# Patient Record
Sex: Female | Born: 1985 | Race: White | Hispanic: No | Marital: Married | Smoking: Never smoker
Health system: Southern US, Community
[De-identification: ages and names within clinical notes are randomized; demographics above are authoritative.]

## PROBLEM LIST (undated history)

## (undated) DIAGNOSIS — R011 Cardiac murmur, unspecified: Secondary | ICD-10-CM

## (undated) DIAGNOSIS — K219 Gastro-esophageal reflux disease without esophagitis: Secondary | ICD-10-CM

## (undated) DIAGNOSIS — F419 Anxiety disorder, unspecified: Secondary | ICD-10-CM

## (undated) HISTORY — PX: OTHER SURGICAL HISTORY: SHX169

---

## 2017-08-23 ENCOUNTER — Encounter: Payer: Self-pay | Admitting: Internal Medicine

## 2017-10-07 ENCOUNTER — Telehealth: Payer: Self-pay | Admitting: *Deleted

## 2017-10-07 ENCOUNTER — Other Ambulatory Visit: Payer: Self-pay | Admitting: *Deleted

## 2017-10-07 ENCOUNTER — Encounter: Payer: Self-pay | Admitting: Nurse Practitioner

## 2017-10-07 ENCOUNTER — Encounter: Payer: Self-pay | Admitting: *Deleted

## 2017-10-07 ENCOUNTER — Ambulatory Visit (INDEPENDENT_AMBULATORY_CARE_PROVIDER_SITE_OTHER): Payer: 59 | Admitting: Nurse Practitioner

## 2017-10-07 DIAGNOSIS — R109 Unspecified abdominal pain: Secondary | ICD-10-CM | POA: Insufficient documentation

## 2017-10-07 DIAGNOSIS — K5900 Constipation, unspecified: Secondary | ICD-10-CM | POA: Diagnosis not present

## 2017-10-07 DIAGNOSIS — R1084 Generalized abdominal pain: Secondary | ICD-10-CM | POA: Diagnosis not present

## 2017-10-07 DIAGNOSIS — R1013 Epigastric pain: Secondary | ICD-10-CM | POA: Diagnosis not present

## 2017-10-07 MED ORDER — PANTOPRAZOLE SODIUM 40 MG PO TBEC: 40.0000 mg | DELAYED_RELEASE_TABLET | Freq: Every day | ORAL | 3 refills | Status: DC

## 2017-10-07 NOTE — Assessment & Plan Note (Signed)
The patient describes mild intermittent constipation.  No red flag/warning signs or symptoms.  At this point I will have her start taking Colace once a day to help have better bowel movements.  Return for follow-up in 3 months.

## 2017-10-07 NOTE — Patient Instructions (Signed)
1. I have sent a prescription for Protonix to your pharmacy.  Take this once a day on an empty stomach. 2. Stop taking Prilosec. 3. Take Zantac once a day in the evenings. 4. Start taking Colace stool softener once a day.  You can obtain this over-the-counter. 5. We will help you schedule the ultrasound of your gallbladder. 6. We will help schedule your upper endoscopy to evaluate your stomach symptoms. 7. Return for follow-up in 3 months. 8. Call us if you have any questions or concerns.

## 2017-10-07 NOTE — Assessment & Plan Note (Signed)
Abdominal pain includes "hot" feeling in her stomach as well as right upper quadrant pain.  The epigastric/stomach pain started a few months ago and was relatively rapid onset.  Because of this we will do an EGD as per above.  The right upper quadrant pain started about 1 month after she began eating a keto diet which is high fat high protein.  She may have precipitated some biliary symptoms.  I will check a right upper quadrant ultrasound for cholelithiasis.  Pending the results she may need a HIDA scan for further biliary evaluation.  At the end of our visit she did mention that her right upper quadrant pain tends to be worse after eating.  Return for follow-up in 3 months.

## 2017-10-07 NOTE — H&P (View-Only) (Signed)
Primary Care Physician:  Erasmo Downer, NP Primary Gastroenterologist:  Dr. Jena Gauss  Chief Complaint  Patient presents with  . Abdominal Pain    comes and goes, "stomach gets hot"; tried Zantac and Omeprazole    HPI:   Holly Patton is a 32 y.o. female who presents on referral from primary care for abdominal pain.  PCP note reviewed dated 08/17/2017.  The patient was seen on referral from urgent care where she was given a prescription for Prilosec for "stomach ulcer symptoms".  She was also given Zantac.  Her symptoms improved somewhat on these.  [facility] recommended Carafate, refill omeprazole, start Zantac.  Referred to GI.  Labs included with PCP notes indicate normal CMP, normal CBC specifically hemoglobin of 13.9 and platelet count of 281.  Today she states she's doing ok overall. Has been on omeprazole and Zantac with minimal improvement in symptoms ("barely"). Ran out of PPI a week ago so has been on Zantac twice daily. Sometimes during the day she'll take Milk of Magnesia, which hasn't helped. Hasn't noticed a difference in her symptoms when she ran out of PPI. Pain is RUQ point tenderness and when she pushes on it it has a "gristle feeling, like a dime or penny." Pain described as burning. Also with a "hot" sensation only in her stomach. No esophageal burning noted. Side pains started a month after starting a keto diet. Hotness in the stomach started a few months ago. Denies hematochezia, melena, dysphagia, fever, chills, unintentional weight loss. Has had some intermittent/rare lightheadedness. Also with significant fatigue. Also used to have a bowel movement twice a day. Now with increased constipation with hard stools, straining intermittently. Now has a bowel movement about about every other day. Denies chest pain, dyspnea, dizziness, syncope, near syncope. Denies any other upper or lower GI symptoms.   Not currently on any regular medications. Denies NSAIDs and ASA  powders.  History reviewed. No pertinent past medical history.  Past Surgical History:  Procedure Laterality Date  . NONE TO DATE      No current outpatient medications on file.   No current facility-administered medications for this visit.     Allergies as of 10/07/2017 - Review Complete 10/07/2017  Allergen Reaction Noted  . Sulfa antibiotics Rash 10/07/2017    History reviewed. No pertinent family history.  Social History   Socioeconomic History  . Marital status: Married    Spouse name: Not on file  . Number of children: Not on file  . Years of education: Not on file  . Highest education level: Not on file  Social Needs  . Financial resource strain: Not on file  . Food insecurity - worry: Not on file  . Food insecurity - inability: Not on file  . Transportation needs - medical: Not on file  . Transportation needs - non-medical: Not on file  Occupational History  . Not on file  Tobacco Use  . Smoking status: Never Smoker  . Smokeless tobacco: Never Used  Substance and Sexual Activity  . Alcohol use: No    Frequency: Never  . Drug use: No  . Sexual activity: Not on file  Other Topics Concern  . Not on file  Social History Narrative  . Not on file    Review of Systems: General: Negative for anorexia, weight loss, fever, chills, fatigue, weakness. ENT: Negative for hoarseness, difficulty swallowing , nasal congestion. CV: Negative for chest pain, angina, palpitations, dyspnea on exertion, peripheral edema.  Respiratory: Negative  for dyspnea at rest, dyspnea on exertion, cough, sputum, wheezing.  GI: See history of present illness. MS: Negative for joint pain, low back pain.  Derm: Negative for rash or itching.  Endo: Negative for unusual weight change.  Heme: Negative for bruising or bleeding. Allergy: Negative for rash or hives.    Physical Exam: BP 110/73   Pulse 71   Temp (!) 96.7 F (35.9 C) (Oral)   Ht 5\' 1"  (1.549 m)   Wt 109 lb 12.8 oz  (49.8 kg)   LMP 10/01/2017 (Approximate)   BMI 20.75 kg/m  General:   Alert and oriented. Pleasant and cooperative. Well-nourished and well-developed.  Eyes:  Without icterus, sclera clear and conjunctiva pink.  Ears:  Normal auditory acuity. Cardiovascular:  S1, S2 present without murmurs appreciated. Extremities without clubbing or edema. Respiratory:  Clear to auscultation bilaterally. No wheezes, rales, or rhonchi. No distress.  Gastrointestinal:  +BS, soft, non-tender and non-distended. No HSM noted. No guarding or rebound. No masses appreciated.  Rectal:  Deferred  Musculoskalatal:  Symmetrical without gross deformities. Neurologic:  Alert and oriented x4;  grossly normal neurologically. Psych:  Alert and cooperative. Normal mood and affect. Heme/Lymph/Immune: No excessive bruising noted.    10/07/2017 10:41 AM   Disclaimer: This note was dictated with voice recognition software. Similar sounding words can inadvertently be transcribed and may not be corrected upon review.

## 2017-10-07 NOTE — Progress Notes (Signed)
CC'D TO PCP °

## 2017-10-07 NOTE — Telephone Encounter (Signed)
Spoke with pt and is aware pre-op scheduled for 10/26/17 at 12:45pm. Letter mailed.

## 2017-10-07 NOTE — Assessment & Plan Note (Addendum)
The patient began having relatively acute onset dyspepsia symptoms including a burning in her stomach, epigastric pain.  She was started on Prilosec which did not help much.  At this point I will change her to Protonix twice daily.  We will set her up for an EGD for dyspepsia poorly responding to PPI therapy.  She would likely benefit from a biopsy for H. pylori.  Return for follow-up in 3 months.  Proceed with EGD on propofol/MAC with Dr. Gala Romney in near future: the risks, benefits, and alternatives have been discussed with the patient in detail. The patient states understanding and desires to proceed.  The patient is not on any medications.  She seems very high anxiety and admits that she is high anxiety.  She is younger age.  Because of potential sedation difficulties we will plan for the procedure on propofol/MAC to promote adequate sedation.

## 2017-10-07 NOTE — Progress Notes (Signed)
Primary Care Physician:  Erasmo Downer, NP Primary Gastroenterologist:  Dr. Jena Gauss  Chief Complaint  Patient presents with  . Abdominal Pain    comes and goes, "stomach gets hot"; tried Zantac and Omeprazole    HPI:   Holly Patton is a 32 y.o. female who presents on referral from primary care for abdominal pain.  PCP note reviewed dated 08/17/2017.  The patient was seen on referral from urgent care where she was given a prescription for Prilosec for "stomach ulcer symptoms".  She was also given Zantac.  Her symptoms improved somewhat on these.  [facility] recommended Carafate, refill omeprazole, start Zantac.  Referred to GI.  Labs included with PCP notes indicate normal CMP, normal CBC specifically hemoglobin of 13.9 and platelet count of 281.  Today she states she's doing ok overall. Has been on omeprazole and Zantac with minimal improvement in symptoms ("barely"). Ran out of PPI a week ago so has been on Zantac twice daily. Sometimes during the day she'll take Milk of Magnesia, which hasn't helped. Hasn't noticed a difference in her symptoms when she ran out of PPI. Pain is RUQ point tenderness and when she pushes on it it has a "gristle feeling, like a dime or penny." Pain described as burning. Also with a "hot" sensation only in her stomach. No esophageal burning noted. Side pains started a month after starting a keto diet. Hotness in the stomach started a few months ago. Denies hematochezia, melena, dysphagia, fever, chills, unintentional weight loss. Has had some intermittent/rare lightheadedness. Also with significant fatigue. Also used to have a bowel movement twice a day. Now with increased constipation with hard stools, straining intermittently. Now has a bowel movement about about every other day. Denies chest pain, dyspnea, dizziness, syncope, near syncope. Denies any other upper or lower GI symptoms.   Not currently on any regular medications. Denies NSAIDs and ASA  powders.  History reviewed. No pertinent past medical history.  Past Surgical History:  Procedure Laterality Date  . NONE TO DATE      No current outpatient medications on file.   No current facility-administered medications for this visit.     Allergies as of 10/07/2017 - Review Complete 10/07/2017  Allergen Reaction Noted  . Sulfa antibiotics Rash 10/07/2017    History reviewed. No pertinent family history.  Social History   Socioeconomic History  . Marital status: Married    Spouse name: Not on file  . Number of children: Not on file  . Years of education: Not on file  . Highest education level: Not on file  Social Needs  . Financial resource strain: Not on file  . Food insecurity - worry: Not on file  . Food insecurity - inability: Not on file  . Transportation needs - medical: Not on file  . Transportation needs - non-medical: Not on file  Occupational History  . Not on file  Tobacco Use  . Smoking status: Never Smoker  . Smokeless tobacco: Never Used  Substance and Sexual Activity  . Alcohol use: No    Frequency: Never  . Drug use: No  . Sexual activity: Not on file  Other Topics Concern  . Not on file  Social History Narrative  . Not on file    Review of Systems: General: Negative for anorexia, weight loss, fever, chills, fatigue, weakness. ENT: Negative for hoarseness, difficulty swallowing , nasal congestion. CV: Negative for chest pain, angina, palpitations, dyspnea on exertion, peripheral edema.  Respiratory: Negative  for dyspnea at rest, dyspnea on exertion, cough, sputum, wheezing.  GI: See history of present illness. MS: Negative for joint pain, low back pain.  Derm: Negative for rash or itching.  Endo: Negative for unusual weight change.  Heme: Negative for bruising or bleeding. Allergy: Negative for rash or hives.    Physical Exam: BP 110/73   Pulse 71   Temp (!) 96.7 F (35.9 C) (Oral)   Ht 5\' 1"  (1.549 m)   Wt 109 lb 12.8 oz  (49.8 kg)   LMP 10/01/2017 (Approximate)   BMI 20.75 kg/m  General:   Alert and oriented. Pleasant and cooperative. Well-nourished and well-developed.  Eyes:  Without icterus, sclera clear and conjunctiva pink.  Ears:  Normal auditory acuity. Cardiovascular:  S1, S2 present without murmurs appreciated. Extremities without clubbing or edema. Respiratory:  Clear to auscultation bilaterally. No wheezes, rales, or rhonchi. No distress.  Gastrointestinal:  +BS, soft, non-tender and non-distended. No HSM noted. No guarding or rebound. No masses appreciated.  Rectal:  Deferred  Musculoskalatal:  Symmetrical without gross deformities. Neurologic:  Alert and oriented x4;  grossly normal neurologically. Psych:  Alert and cooperative. Normal mood and affect. Heme/Lymph/Immune: No excessive bruising noted.    10/07/2017 10:41 AM   Disclaimer: This note was dictated with voice recognition software. Similar sounding words can inadvertently be transcribed and may not be corrected upon review.

## 2017-10-13 ENCOUNTER — Ambulatory Visit: Payer: Self-pay | Admitting: Nurse Practitioner

## 2017-10-13 ENCOUNTER — Ambulatory Visit (HOSPITAL_COMMUNITY): Admission: RE | Admit: 2017-10-13 | Payer: 59 | Source: Ambulatory Visit

## 2017-10-14 ENCOUNTER — Ambulatory Visit (HOSPITAL_COMMUNITY)
Admission: RE | Admit: 2017-10-14 | Discharge: 2017-10-14 | Disposition: A | Discharge status: Home / Self Care | Payer: 59 | Source: Ambulatory Visit | Attending: Nurse Practitioner | Admitting: Nurse Practitioner

## 2017-10-14 DIAGNOSIS — R932 Abnormal findings on diagnostic imaging of liver and biliary tract: Secondary | ICD-10-CM | POA: Insufficient documentation

## 2017-10-14 DIAGNOSIS — R1013 Epigastric pain: Secondary | ICD-10-CM | POA: Insufficient documentation

## 2017-10-14 DIAGNOSIS — R1084 Generalized abdominal pain: Secondary | ICD-10-CM | POA: Insufficient documentation

## 2017-10-14 DIAGNOSIS — K5900 Constipation, unspecified: Secondary | ICD-10-CM | POA: Insufficient documentation

## 2017-10-18 ENCOUNTER — Telehealth: Payer: Self-pay | Admitting: *Deleted

## 2017-10-18 NOTE — Telephone Encounter (Signed)
Patient called requesting her u/s results. Please advise thanks

## 2017-10-19 ENCOUNTER — Telehealth: Payer: Self-pay | Admitting: Internal Medicine

## 2017-10-19 NOTE — Telephone Encounter (Signed)
Lmom, will notified pt when results are available.

## 2017-10-19 NOTE — Telephone Encounter (Signed)
Pt had US done on 1/24 and was calling to see if her results were available. Please call [phone]

## 2017-10-19 NOTE — Telephone Encounter (Signed)
See result note.  

## 2017-10-22 NOTE — Patient Instructions (Signed)
Holly Patton  10/22/2017     @PREFPERIOPPHARMACY @   Your procedure is scheduled on  11/01/2017 .  Report to [facility] at  1100   A.M.  Call this number if you have problems the morning of surgery:  [phone]   Remember:  Do not eat food or drink liquids after midnight.  Take these medicines the morning of surgery with A SIP OF WATER  Protonix, zantac.   Do not wear jewelry, make-up or nail polish.  Do not wear lotions, powders, or perfumes, or deodorant.  Do not shave 48 hours prior to surgery.  Men may shave face and neck.  Do not bring valuables to the hospital.  [facility] is not responsible for any belongings or valuables.  Contacts, dentures or bridgework may not be worn into surgery.  Leave your suitcase in the car.  After surgery it may be brought to your room.  For patients admitted to the hospital, discharge time will be determined by your treatment team.  Patients discharged the day of surgery will not be allowed to drive home.   Name and phone number of your driver:   family Special instructions:  None  Please read over the following fact sheets that you were given. Anesthesia Post-op Instructions and Care and Recovery After Surgery       Esophagogastroduodenoscopy Esophagogastroduodenoscopy (EGD) is a procedure to examine the lining of the esophagus, stomach, and first part of the small intestine (duodenum). This procedure is done to check for problems such as inflammation, bleeding, ulcers, or growths. During this procedure, a long, flexible, lighted tube with a camera attached (endoscope) is inserted down the throat. Tell a health care provider about:  Any allergies you have.  All medicines you are taking, including vitamins, herbs, eye drops, creams, and over-the-counter medicines.  Any problems you or family members have had with anesthetic medicines.  Any blood disorders you have.  Any surgeries you have had.  Any medical  conditions you have.  Whether you are pregnant or may be pregnant. What are the risks? Generally, this is a safe procedure. However, problems may occur, including:  Infection.  Bleeding.  A tear (perforation) in the esophagus, stomach, or duodenum.  Trouble breathing.  Excessive sweating.  Spasms of the larynx.  A slowed heartbeat.  Low blood pressure.  What happens before the procedure?  Follow instructions from your health care provider about eating or drinking restrictions.  Ask your health care provider about: ? Changing or stopping your regular medicines. This is especially important if you are taking diabetes medicines or blood thinners. ? Taking medicines such as aspirin and ibuprofen. These medicines can thin your blood. Do not take these medicines before your procedure if your health care provider instructs you not to.  Plan to have someone take you home after the procedure.  If you wear dentures, be ready to remove them before the procedure. What happens during the procedure?  To reduce your risk of infection, your health care team will wash or sanitize their hands.  An IV tube will be put in a vein in your hand or arm. You will get medicines and fluids through this tube.  You will be given one or more of the following: ? A medicine to help you relax (sedative). ? A medicine to numb the area (local anesthetic). This medicine may be sprayed into your throat. It will make you feel more comfortable and keep  you from gagging or coughing during the procedure. ? A medicine for pain.  A mouth guard may be placed in your mouth to protect your teeth and to keep you from biting on the endoscope.  You will be asked to lie on your left side.  The endoscope will be lowered down your throat into your esophagus, stomach, and duodenum.  Air will be put into the endoscope. This will help your health care provider see better.  The lining of your esophagus, stomach, and  duodenum will be examined.  Your health care provider may: ? Take a tissue sample so it can be looked at in a lab (biopsy). ? Remove growths. ? Remove objects (foreign bodies) that are stuck. ? Treat any bleeding with medicines or other devices that stop tissue from bleeding. ? Widen (dilate) or stretch narrowed areas of your esophagus and stomach.  The endoscope will be taken out. The procedure may vary among health care providers and hospitals. What happens after the procedure?  Your blood pressure, heart rate, breathing rate, and blood oxygen level will be monitored often until the medicines you were given have worn off.  Do not eat or drink anything until the numbing medicine has worn off and your gag reflex has returned. This information is not intended to replace advice given to you by your health care provider. Make sure you discuss any questions you have with your health care provider. Document Released: 01/08/2005 Document Revised: 02/13/2016 Document Reviewed: 08/01/2015 Elsevier Interactive Patient Education  2018 [employer]. Esophagogastroduodenoscopy, Care After Refer to this sheet in the next few weeks. These instructions provide you with information about caring for yourself after your procedure. Your health care provider may also give you more specific instructions. Your treatment has been planned according to current medical practices, but problems sometimes occur. Call your health care provider if you have any problems or questions after your procedure. What can I expect after the procedure? After the procedure, it is common to have:  A sore throat.  Nausea.  Bloating.  Dizziness.  Fatigue.  Follow these instructions at home:  Do not eat or drink anything until the numbing medicine (local anesthetic) has worn off and your gag reflex has returned. You will know that the local anesthetic has worn off when you can swallow comfortably.  Do not drive for 24 hours  if you received a medicine to help you relax (sedative).  If your health care provider took a tissue sample for testing during the procedure, make sure to get your test results. This is your responsibility. Ask your health care provider or the department performing the test when your results will be ready.  Keep all follow-up visits as told by your health care provider. This is important. Contact a health care provider if:  You cannot stop coughing.  You are not urinating.  You are urinating less than usual. Get help right away if:  You have trouble swallowing.  You cannot eat or drink.  You have throat or chest pain that gets worse.  You are dizzy or light-headed.  You faint.  You have nausea or vomiting.  You have chills.  You have a fever.  You have severe abdominal pain.  You have black, tarry, or bloody stools. This information is not intended to replace advice given to you by your health care provider. Make sure you discuss any questions you have with your health care provider. Document Released: 08/24/2012 Document Revised: 02/13/2016 Document Reviewed: 08/01/2015 Elsevier  Interactive Patient Education  2018 Elsevier Inc.  Monitored Anesthesia Care Anesthesia is a term that refers to techniques, procedures, and medicines that help a person stay safe and comfortable during a medical procedure. Monitored anesthesia care, or sedation, is one type of anesthesia. Your anesthesia specialist may recommend sedation if you will be having a procedure that does not require you to be unconscious, such as:  Cataract surgery.  A dental procedure.  A biopsy.  A colonoscopy.  During the procedure, you may receive a medicine to help you relax (sedative). There are three levels of sedation:  Mild sedation. At this level, you may feel awake and relaxed. You will be able to follow directions.  Moderate sedation. At this level, you will be sleepy. You may not remember the  procedure.  Deep sedation. At this level, you will be asleep. You will not remember the procedure.  The more medicine you are given, the deeper your level of sedation will be. Depending on how you respond to the procedure, the anesthesia specialist may change your level of sedation or the type of anesthesia to fit your needs. An anesthesia specialist will monitor you closely during the procedure. Let your health care provider know about:  Any allergies you have.  All medicines you are taking, including vitamins, herbs, eye drops, creams, and over-the-counter medicines.  Any use of steroids (by mouth or as a cream).  Any problems you or family members have had with sedatives and anesthetic medicines.  Any blood disorders you have.  Any surgeries you have had.  Any medical conditions you have, such as sleep apnea.  Whether you are pregnant or may be pregnant.  Any use of cigarettes, alcohol, or street drugs. What are the risks? Generally, this is a safe procedure. However, problems may occur, including:  Getting too much medicine (oversedation).  Nausea.  Allergic reaction to medicines.  Trouble breathing. If this happens, a breathing tube may be used to help with breathing. It will be removed when you are awake and breathing on your own.  Heart trouble.  Lung trouble.  Before the procedure Staying hydrated Follow instructions from your health care provider about hydration, which may include:  Up to 2 hours before the procedure - you may continue to drink clear liquids, such as water, clear fruit juice, black coffee, and plain tea.  Eating and drinking restrictions Follow instructions from your health care provider about eating and drinking, which may include:  8 hours before the procedure - stop eating heavy meals or foods such as meat, fried foods, or fatty foods.  6 hours before the procedure - stop eating light meals or foods, such as toast or cereal.  6 hours  before the procedure - stop drinking milk or drinks that contain milk.  2 hours before the procedure - stop drinking clear liquids.  Medicines Ask your health care provider about:  Changing or stopping your regular medicines. This is especially important if you are taking diabetes medicines or blood thinners.  Taking medicines such as aspirin and ibuprofen. These medicines can thin your blood. Do not take these medicines before your procedure if your health care provider instructs you not to.  Tests and exams  You will have a physical exam.  You may have blood tests done to show: ? How well your kidneys and liver are working. ? How well your blood can clot.  General instructions  Plan to have someone take you home from the hospital or clinic.  If you will be going home right after the procedure, plan to have someone with you for 24 hours.  What happens during the procedure?  Your blood pressure, heart rate, breathing, level of pain and overall condition will be monitored.  An IV tube will be inserted into one of your veins.  Your anesthesia specialist will give you medicines as needed to keep you comfortable during the procedure. This may mean changing the level of sedation.  The procedure will be performed. After the procedure  Your blood pressure, heart rate, breathing rate, and blood oxygen level will be monitored until the medicines you were given have worn off.  Do not drive for 24 hours if you received a sedative.  You may: ? Feel sleepy, clumsy, or nauseous. ? Feel forgetful about what happened after the procedure. ? Have a sore throat if you had a breathing tube during the procedure. ? Vomit. This information is not intended to replace advice given to you by your health care provider. Make sure you discuss any questions you have with your health care provider. Document Released: 06/03/2005 Document Revised: 02/14/2016 Document Reviewed: 12/29/2015 Elsevier  Interactive Patient Education  2018 Elsevier Inc. Monitored Anesthesia Care, Care After These instructions provide you with information about caring for yourself after your procedure. Your health care provider may also give you more specific instructions. Your treatment has been planned according to current medical practices, but problems sometimes occur. Call your health care provider if you have any problems or questions after your procedure. What can I expect after the procedure? After your procedure, it is common to:  Feel sleepy for several hours.  Feel clumsy and have poor balance for several hours.  Feel forgetful about what happened after the procedure.  Have poor judgment for several hours.  Feel nauseous or vomit.  Have a sore throat if you had a breathing tube during the procedure.  Follow these instructions at home: For at least 24 hours after the procedure:   Do not: ? Participate in activities in which you could fall or become injured. ? Drive. ? Use heavy machinery. ? Drink alcohol. ? Take sleeping pills or medicines that cause drowsiness. ? Make important decisions or sign legal documents. ? Take care of children on your own.  Rest. Eating and drinking  Follow the diet that is recommended by your health care provider.  If you vomit, drink water, juice, or soup when you can drink without vomiting.  Make sure you have little or no nausea before eating solid foods. General instructions  Have a responsible adult stay with you until you are awake and alert.  Take over-the-counter and prescription medicines only as told by your health care provider.  If you smoke, do not smoke without supervision.  Keep all follow-up visits as told by your health care provider. This is important. Contact a health care provider if:  You keep feeling nauseous or you keep vomiting.  You feel light-headed.  You develop a rash.  You have a fever. Get help right away  if:  You have trouble breathing. This information is not intended to replace advice given to you by your health care provider. Make sure you discuss any questions you have with your health care provider. Document Released: 12/29/2015 Document Revised: 04/29/2016 Document Reviewed: 12/29/2015 Elsevier Interactive Patient Education  [employer]2018 Elsevier Inc.

## 2017-10-26 ENCOUNTER — Other Ambulatory Visit: Payer: Self-pay

## 2017-10-26 ENCOUNTER — Encounter (HOSPITAL_COMMUNITY)
Admission: RE | Admit: 2017-10-26 | Discharge: 2017-10-26 | Disposition: A | Discharge status: Home / Self Care | Payer: 59 | Source: Ambulatory Visit | Attending: Internal Medicine | Admitting: Internal Medicine

## 2017-10-26 ENCOUNTER — Encounter (HOSPITAL_COMMUNITY): Payer: Self-pay

## 2017-10-26 DIAGNOSIS — R1084 Generalized abdominal pain: Secondary | ICD-10-CM | POA: Insufficient documentation

## 2017-10-26 DIAGNOSIS — Z01818 Encounter for other preprocedural examination: Secondary | ICD-10-CM | POA: Diagnosis present

## 2017-10-26 DIAGNOSIS — R1013 Epigastric pain: Secondary | ICD-10-CM | POA: Insufficient documentation

## 2017-10-26 HISTORY — DX: Gastro-esophageal reflux disease without esophagitis: K21.9

## 2017-10-26 HISTORY — DX: Anxiety disorder, unspecified: F41.9

## 2017-10-26 HISTORY — DX: Cardiac murmur, unspecified: R01.1

## 2017-10-26 LAB — CBC WITH DIFFERENTIAL/PLATELET
BASOS PCT: 0 %
Basophils Absolute: 0 10*3/uL (ref 0.0–0.1)
EOS ABS: 0.1 10*3/uL (ref 0.0–0.7)
Eosinophils Relative: 1 %
HEMATOCRIT: 39 % (ref 36.0–46.0)
HEMOGLOBIN: 13 g/dL (ref 12.0–15.0)
Lymphocytes Relative: 32 %
Lymphs Abs: 2.3 10*3/uL (ref 0.7–4.0)
MCH: 30.2 pg (ref 26.0–34.0)
MCHC: 33.3 g/dL (ref 30.0–36.0)
MCV: 90.7 fL (ref 78.0–100.0)
Monocytes Absolute: 0.5 10*3/uL (ref 0.1–1.0)
Monocytes Relative: 7 %
NEUTROS ABS: 4.3 10*3/uL (ref 1.7–7.7)
NEUTROS PCT: 60 %
Platelets: 231 10*3/uL (ref 150–400)
RBC: 4.3 MIL/uL (ref 3.87–5.11)
RDW: 12.4 % (ref 11.5–15.5)
WBC: 7.1 10*3/uL (ref 4.0–10.5)

## 2017-10-26 LAB — HCG, SERUM, QUALITATIVE: PREG SERUM: NEGATIVE

## 2017-11-01 ENCOUNTER — Ambulatory Visit (HOSPITAL_COMMUNITY): Payer: 59 | Admitting: Anesthesiology

## 2017-11-01 ENCOUNTER — Encounter (HOSPITAL_COMMUNITY)
Admission: RE | Disposition: A | Discharge status: Home / Self Care | Payer: Self-pay | Source: Ambulatory Visit | Attending: Internal Medicine

## 2017-11-01 ENCOUNTER — Encounter (HOSPITAL_COMMUNITY): Payer: Self-pay | Admitting: Anesthesiology

## 2017-11-01 ENCOUNTER — Ambulatory Visit (HOSPITAL_COMMUNITY)
Admission: RE | Admit: 2017-11-01 | Discharge: 2017-11-01 | Disposition: A | Discharge status: Home / Self Care | Payer: 59 | Source: Ambulatory Visit | Attending: Internal Medicine | Admitting: Internal Medicine

## 2017-11-01 DIAGNOSIS — K210 Gastro-esophageal reflux disease with esophagitis: Secondary | ICD-10-CM | POA: Insufficient documentation

## 2017-11-01 DIAGNOSIS — R1013 Epigastric pain: Secondary | ICD-10-CM

## 2017-11-01 DIAGNOSIS — R1084 Generalized abdominal pain: Secondary | ICD-10-CM

## 2017-11-01 DIAGNOSIS — K209 Esophagitis, unspecified: Secondary | ICD-10-CM | POA: Diagnosis not present

## 2017-11-01 DIAGNOSIS — K5900 Constipation, unspecified: Secondary | ICD-10-CM | POA: Diagnosis not present

## 2017-11-01 HISTORY — PX: ESOPHAGOGASTRODUODENOSCOPY (EGD) WITH PROPOFOL: SHX5813

## 2017-11-01 SURGERY — ESOPHAGOGASTRODUODENOSCOPY (EGD) WITH PROPOFOL
Anesthesia: Monitor Anesthesia Care

## 2017-11-01 MED ORDER — MIDAZOLAM HCL 2 MG/2ML IJ SOLN
1.0000 mg | INTRAMUSCULAR | Status: AC
  Administered 2017-11-01: 2 mg via INTRAVENOUS

## 2017-11-01 MED ORDER — PROPOFOL 500 MG/50ML IV EMUL
INTRAVENOUS | Status: DC | PRN
  Administered 2017-11-01: 125 ug/kg/min via INTRAVENOUS

## 2017-11-01 MED ORDER — FENTANYL CITRATE (PF) 100 MCG/2ML IJ SOLN
INTRAMUSCULAR | Status: AC
  Filled 2017-11-01: qty 2

## 2017-11-01 MED ORDER — MIDAZOLAM HCL 5 MG/5ML IJ SOLN
INTRAMUSCULAR | Status: DC | PRN
  Administered 2017-11-01: 2 mg via INTRAVENOUS

## 2017-11-01 MED ORDER — MIDAZOLAM HCL 2 MG/2ML IJ SOLN
INTRAMUSCULAR | Status: AC
  Filled 2017-11-01: qty 2

## 2017-11-01 MED ORDER — LIDOCAINE VISCOUS 2 % MT SOLN
15.0000 mL | Freq: Once | OROMUCOSAL | Status: AC
  Administered 2017-11-01: 5 mL via OROMUCOSAL

## 2017-11-01 MED ORDER — LIDOCAINE VISCOUS 2 % MT SOLN
OROMUCOSAL | Status: AC
  Filled 2017-11-01: qty 15

## 2017-11-01 MED ORDER — CHLORHEXIDINE GLUCONATE CLOTH 2 % EX PADS: 6.0000 | MEDICATED_PAD | Freq: Once | CUTANEOUS | Status: DC

## 2017-11-01 MED ORDER — PROPOFOL 10 MG/ML IV BOLUS
INTRAVENOUS | Status: AC
  Filled 2017-11-01: qty 20

## 2017-11-01 MED ORDER — LACTATED RINGERS IV SOLN
INTRAVENOUS | Status: DC
  Administered 2017-11-01: 12:00:00 via INTRAVENOUS

## 2017-11-01 MED ORDER — FENTANYL CITRATE (PF) 100 MCG/2ML IJ SOLN
25.0000 ug | Freq: Once | INTRAMUSCULAR | Status: AC
  Administered 2017-11-01: 25 ug via INTRAVENOUS

## 2017-11-01 NOTE — Discharge Instructions (Signed)
EGD Discharge instructions Please read the instructions outlined below and refer to this sheet in the next few weeks. These discharge instructions provide you with general information on caring for yourself after you leave the hospital. Your doctor may also give you specific instructions. While your treatment has been planned according to the most current medical practices available, unavoidable complications occasionally occur. If you have any problems or questions after discharge, please call your doctor. ACTIVITY  You may resume your regular activity but move at a slower pace for the next 24 hours.   Take frequent rest periods for the next 24 hours.   Walking will help expel (get rid of) the air and reduce the bloated feeling in your abdomen.   No driving for 24 hours (because of the anesthesia (medicine) used during the test).   You may shower.   Do not sign any important legal documents or operate any machinery for 24 hours (because of the anesthesia used during the test).  NUTRITION  Drink plenty of fluids.   You may resume your normal diet.   Begin with a light meal and progress to your normal diet.   Avoid alcoholic beverages for 24 hours or as instructed by your caregiver.  MEDICATIONS  You may resume your normal medications unless your caregiver tells you otherwise.  WHAT YOU CAN EXPECT TODAY  You may experience abdominal discomfort such as a feeling of fullness or gas pains.  FOLLOW-UP  Your doctor will discuss the results of your test with you.  SEEK IMMEDIATE MEDICAL ATTENTION IF ANY OF THE FOLLOWING OCCUR:  Excessive nausea (feeling sick to your stomach) and/or vomiting.   Severe abdominal pain and distention (swelling).   Trouble swallowing.   Temperature over 101 F (37.8 C).   Rectal bleeding or vomiting of blood.    GERD information provided  Would limit NSAIDs like ibuprofen as much as possible  Continue Protonix 40 mg daily  Office visit us  in 3 months.      Gastroesophageal Reflux Disease, Adult Normally, food travels down the esophagus and stays in the stomach to be digested. If a person has gastroesophageal reflux disease (GERD), food and stomach acid move back up into the esophagus. When this happens, the esophagus becomes sore and swollen (inflamed). Over time, GERD can make small holes (ulcers) in the lining of the esophagus. Follow these instructions at home: Diet  Follow a diet as told by your doctor. You may need to avoid foods and drinks such as: ? Coffee and tea (with or without caffeine). ? Drinks that contain alcohol. ? Energy drinks and sports drinks. ? Carbonated drinks or sodas. ? Chocolate and cocoa. ? Peppermint and mint flavorings. ? Garlic and onions. ? Horseradish. ? Spicy and acidic foods, such as peppers, chili powder, curry powder, vinegar, hot sauces, and BBQ sauce. ? Citrus fruit juices and citrus fruits, such as oranges, lemons, and limes. ? Tomato-based foods, such as red sauce, chili, salsa, and pizza with red sauce. ? Fried and fatty foods, such as donuts, french fries, potato chips, and high-fat dressings. ? High-fat meats, such as hot dogs, rib eye steak, sausage, ham, and bacon. ? High-fat dairy items, such as whole milk, butter, and cream cheese.  Eat small meals often. Avoid eating large meals.  Avoid drinking large amounts of liquid with your meals.  Avoid eating meals during the 2-3 hours before bedtime.  Avoid lying down right after you eat.  Do not exercise right after you eat.  General instructions  Pay attention to any changes in your symptoms.  Take over-the-counter and prescription medicines only as told by your doctor. Do not take aspirin, ibuprofen, or other NSAIDs unless your doctor says it is okay.  Do not use any tobacco products, including cigarettes, chewing tobacco, and e-cigarettes. If you need help quitting, ask your doctor.  Wear loose clothes. Do not wear  anything tight around your waist.  Raise (elevate) the head of your bed about 6 inches (15 cm).  Try to lower your stress. If you need help doing this, ask your doctor.  If you are overweight, lose an amount of weight that is healthy for you. Ask your doctor about a safe weight loss goal.  Keep all follow-up visits as told by your doctor. This is important. Contact a doctor if:  You have new symptoms.  You lose weight and you do not know why it is happening.  You have trouble swallowing, or it hurts to swallow.  You have wheezing or a cough that keeps happening.  Your symptoms do not get better with treatment.  You have a hoarse voice. Get help right away if:  You have pain in your arms, neck, jaw, teeth, or back.  You feel sweaty, dizzy, or light-headed.  You have chest pain or shortness of breath.  You throw up (vomit) and your throw up looks like blood or coffee grounds.  You pass out (faint).  Your poop (stool) is bloody or black.  You cannot swallow, drink, or eat. This information is not intended to replace advice given to you by your health care provider. Make sure you discuss any questions you have with your health care provider. Document Released: 02/24/2008 Document Revised: 02/13/2016 Document Reviewed: 01/02/2015 Elsevier Interactive Patient Education  2018 [employer].    PATIENT INSTRUCTIONS POST-ANESTHESIA  IMMEDIATELY FOLLOWING SURGERY:  Do not drive or operate machinery for the first twenty four hours after surgery.  Do not make any important decisions for twenty four hours after surgery or while taking narcotic pain medications or sedatives.  If you develop intractable nausea and vomiting or a severe headache please notify your doctor immediately.  FOLLOW-UP:  Please make an appointment with your surgeon as instructed. You do not need to follow up with anesthesia unless specifically instructed to do so.  WOUND CARE INSTRUCTIONS (if applicable):   Keep a dry clean dressing on the anesthesia/puncture wound site if there is drainage.  Once the wound has quit draining you may leave it open to air.  Generally you should leave the bandage intact for twenty four hours unless there is drainage.  If the epidural site drains for more than 36-48 hours please call the anesthesia department.  QUESTIONS?:  Please feel free to call your physician or the hospital operator if you have any questions, and they will be happy to assist you.

## 2017-11-01 NOTE — Transfer of Care (Signed)
Immediate Anesthesia Transfer of Care Note  Patient: Holly Patton  Procedure(s) Performed: ESOPHAGOGASTRODUODENOSCOPY (EGD) WITH PROPOFOL (N/A )  Patient Location: PACU  Anesthesia Type:MAC  Level of Consciousness: awake and patient cooperative  Airway & Oxygen Therapy: Patient Spontanous Breathing and Patient connected to face mask oxygen  Post-op Assessment: Report given to RN, Post -op Vital signs reviewed and stable and Patient moving all extremities  Post vital signs: Reviewed and stable  Last Vitals:  Vitals:   11/01/17 1205 11/01/17 1210  BP:    Pulse:    Resp: 14 18  Temp:    SpO2: 100% 100%    Last Pain:  Vitals:   11/01/17 1054  TempSrc: Oral      Patients Stated Pain Goal: 1 (09/32/67 1245)  Complications: No apparent anesthesia complications

## 2017-11-01 NOTE — Anesthesia Postprocedure Evaluation (Signed)
Anesthesia Post Note  Patient: Holly Patton  Procedure(s) Performed: ESOPHAGOGASTRODUODENOSCOPY (EGD) WITH PROPOFOL (N/A )  Patient location during evaluation: PACU Anesthesia Type: MAC Level of consciousness: awake and alert and patient cooperative Pain management: pain level controlled Vital Signs Assessment: post-procedure vital signs reviewed and stable Respiratory status: spontaneous breathing Cardiovascular status: blood pressure returned to baseline Postop Assessment: no apparent nausea or vomiting Anesthetic complications: no     Last Vitals:  Vitals:   11/01/17 1210 11/01/17 1245  BP:  103/66  Pulse:  78  Resp: 18 (!) 9  Temp:  (P) 37 C  SpO2: 100% 97%    Last Pain:  Vitals:   11/01/17 1054  TempSrc: Oral                 Jahmil Macleod J

## 2017-11-01 NOTE — Anesthesia Preprocedure Evaluation (Signed)
Anesthesia Evaluation  Patient identified by MRN, date of birth, ID band Patient awake    Reviewed: Allergy & Precautions, NPO status , Patient's Chart, lab work & pertinent test results  Airway Mallampati: II  TM Distance: >3 FB Neck ROM: Full    Dental  (+) Teeth Intact   Pulmonary neg pulmonary ROS,    breath sounds clear to auscultation       Cardiovascular negative cardio ROS   Rhythm:Regular Rate:Normal     Neuro/Psych PSYCHIATRIC DISORDERS Anxiety negative neurological ROS     GI/Hepatic Neg liver ROS, GERD  ,  Endo/Other  negative endocrine ROS  Renal/GU negative Renal ROS     Musculoskeletal   Abdominal   Peds  Hematology negative hematology ROS (+)   Anesthesia Other Findings   Reproductive/Obstetrics                             Anesthesia Physical Anesthesia Plan  ASA: II  Anesthesia Plan: MAC   Post-op Pain Management:    Induction: Intravenous  PONV Risk Score and Plan:   Airway Management Planned: Simple Face Mask  Additional Equipment:   Intra-op Plan:   Post-operative Plan:   Informed Consent: I have reviewed the patients History and Physical, chart, labs and discussed the procedure including the risks, benefits and alternatives for the proposed anesthesia with the patient or authorized representative who has indicated his/her understanding and acceptance.     Plan Discussed with:   Anesthesia Plan Comments:         Anesthesia Quick Evaluation

## 2017-11-01 NOTE — Op Note (Signed)
[facility] Patient Name: Holly Patton Procedure Date: 11/01/2017 11:52 AM MRN: [id number] Date of Birth: 02/24/1986 Attending MD: Gennette Pac , MD CSN: [id number] Age: 32 Admit Type: Outpatient Procedure:                Upper GI endoscopy Indications:              Dyspepsia Providers:                Gennette Pac, MD, Nena Polio, RN, Edythe Clarity, Technician Referring MD:              Medicines:                Propofol per Anesthesia Complications:            No immediate complications. Estimated Blood Loss:     Estimated blood loss: none. Procedure:                Pre-Anesthesia Assessment:                           - Prior to the procedure, a History and Physical                            was performed, and patient medications and                            allergies were reviewed. The patient's tolerance of                            previous anesthesia was also reviewed. The risks                            and benefits of the procedure and the sedation                            options and risks were discussed with the patient.                            All questions were answered, and informed consent                            was obtained. Prior Anticoagulants: The patient has                            taken no previous anticoagulant or antiplatelet                            agents. ASA Grade Assessment: III - A patient with                            severe systemic disease. After reviewing the risks  and benefits, the patient was deemed in                            satisfactory condition to undergo the procedure.                           After obtaining informed consent, the endoscope was                            passed under direct vision. Throughout the                            procedure, the patient's blood pressure, pulse, and                            oxygen saturations were monitored  continuously. The                            [id number]) was introduced through the and                            advanced to the second part of duodenum. The upper                            GI endoscopy was accomplished without difficulty.                            The patient tolerated the procedure well. Scope In: 12:26:20 PM Scope Out: 12:31:59 PM Total Procedure Duration: 0 hours 5 minutes 39 seconds  Findings:      Esophagitis was found. distal esophageal erosions within 5 mm of the GE       junction. No Barrett's epithelium seen.      The entire examined stomach was normal.      The duodenal bulb and second portion of the duodenum were normal.       Estimated blood loss: none. Estimated blood loss: none. Impression:               - Mild erosive reflux esophagitisEsophagitis.                            Patient states symptoms have become much improved                            since she was switched to Protonix 40 mg daily                           - Normal stomach.                           - Normal duodenal bulb and second portion of the                            duodenum.                           -  No specimens collected. Moderate Sedation:      Moderate (conscious) sedation was personally administered by an       anesthesia professional. The following parameters were monitored: oxygen       saturation, heart rate, blood pressure, respiratory rate, EKG, adequacy       of pulmonary ventilation, and response to care. Total physician       intraservice time was 12 minutes. Recommendation:           - Patient has a contact number available for                            emergencies. The signs and symptoms of potential                            delayed complications were discussed with the                            patient. Return to normal activities tomorrow.                            Written discharge instructions were provided to the                             patient.                           - Advance diet as tolerated.                           - Continue present medications. Continue Protonix                            40 mg daily.                           - No repeat upper endoscopy.                           - Return to GI office in 3 months. Procedure Code(s):        --- Professional ---                           [id number], Esophagogastroduodenoscopy, flexible,                            transoral; diagnostic, including collection of                            specimen(s) by brushing or washing, when performed                            (separate procedure) Diagnosis Code(s):        --- Professional ---                           K20.9, Esophagitis, unspecified  R10.13, Epigastric pain CPT copyright 2016 [employer] Association. All rights reserved. The codes documented in this report are preliminary and upon coder review may  be revised to meet current compliance requirements. Gerrit Friends. Rourk, MD Gennette Pac, MD 11/01/2017 12:41:09 PM This report has been signed electronically. Number of Addenda: 0

## 2017-11-01 NOTE — Interval H&P Note (Signed)
History and Physical Interval Note:  11/01/2017 11:57 AM  Ronika Sowles  has presented today for surgery, with the diagnosis of dyspepsia, abd pain  The various methods of treatment have been discussed with the patient and family. After consideration of risks, benefits and other options for treatment, the patient has consented to  Procedure(s) with comments: ESOPHAGOGASTRODUODENOSCOPY (EGD) WITH PROPOFOL (N/A) - 2:00pm as a surgical intervention .  The patient's history has been reviewed, patient examined, no change in status, stable for surgery.  I have reviewed the patient's chart and labs.  Questions were answered to the patient's satisfaction.     Eula ListenRobert Zackry Deines   Much better with Protonix. No dysphagia. Gallbladder ultrasound negative. Diagnostic EGD today per plan.  The risks, benefits, limitations, alternatives and imponderables have been reviewed with the patient. Potential for esophageal dilation, biopsy, etc. have also been reviewed.  Questions have been answered. All parties agreeable.

## 2017-11-03 ENCOUNTER — Encounter (HOSPITAL_COMMUNITY): Payer: Self-pay | Admitting: Internal Medicine

## 2017-11-10 ENCOUNTER — Telehealth: Payer: Self-pay | Admitting: Internal Medicine

## 2017-11-10 NOTE — Telephone Encounter (Signed)
Pt is calling about her results. I told pt we will call her when results are available.

## 2017-11-10 NOTE — Telephone Encounter (Signed)
Pt had procedure done on 2/11 and was calling today to see if her results were back. Please call [phone]

## 2017-11-12 ENCOUNTER — Telehealth: Payer: Self-pay | Admitting: Internal Medicine

## 2017-11-12 NOTE — Telephone Encounter (Signed)
PLEASE CALL PATIENT IF HER PROCEDURE RESULTS ARE BACK

## 2017-11-15 NOTE — Telephone Encounter (Signed)
Pt notified of results. Report is available in Mychart.

## 2018-01-10 ENCOUNTER — Ambulatory Visit: Payer: 59 | Admitting: Nurse Practitioner

## 2018-01-10 ENCOUNTER — Encounter: Payer: Self-pay | Admitting: Nurse Practitioner

## 2018-01-10 ENCOUNTER — Telehealth: Payer: Self-pay | Admitting: Internal Medicine

## 2018-01-10 NOTE — Progress Notes (Deleted)
Referring Provider: Erasmo DownerStrader, Lindsey F, NP Primary Care Physician:  Erasmo DownerStrader, Lindsey F, NP Primary GI:  Dr. Jena Gaussourk  No chief complaint on file.   HPI:   Holly Patton is a 32 y.o. female who presents for follow-up on dyspepsia and abdominal pain.  The patient was last seen in our office 10/07/2017 for dyspepsia, generalized abdominal pain, constipation.  Previously saw a urgent care and prescribed Prilosec and Zantac for "stomach ulcer symptoms."  Carafate was also prescribed by PCP.  Normal labs by primary care.  At her last visit noted minimal improvement in symptoms.  She ran out of PPI and is only been on Zantac twice a day for about a week.  Milk of magnesia ineffective as well.  Pain is right upper quadrant, has a "Bristol feeling like a dime or penny" when she presses on the area.  Described as burning, hot sensation in her stomach.  Pain started prior to beginning a keto diet.  Stomach "hotness" ongoing for a few months.  Significant fatigue.  Increased constipation with hard stools and bowel movement about every other day.  No other GI symptoms.  Recommended change PPI to Protonix, Zantac in the evenings, Colace stool softener, right upper quadrant ultrasound, EGD, follow-up in 3 months.  Right upper quadrant ultrasound completed 10/14/2017 which found no evidence of acute cholecystitis, 2 mm gallbladder polyp versus adherent sludge ball noted to be incidental and not requiring follow-up.  Specifically no sign of acute cholecystitis.  EGD completed 11/01/2017 which found esophagitis, distal esophageal erosions within 5 mm of the GE junction, no Barrett's.  Normal stomach and duodenum.  Patient indicated symptoms improved significantly since starting Protonix.  Recommend continue Protonix, no repeat EGD, follow-up in 3 months.  Today she states   Past Medical History:  Diagnosis Date  . Anxiety   . GERD (gastroesophageal reflux disease)   . Heart murmur     Past Surgical History:    Procedure Laterality Date  . ESOPHAGOGASTRODUODENOSCOPY (EGD) WITH PROPOFOL N/A 11/01/2017   Procedure: ESOPHAGOGASTRODUODENOSCOPY (EGD) WITH PROPOFOL;  Surgeon: Corbin Adeourk, Robert M, MD;  Location: [facility];  Service: Endoscopy;  Laterality: N/A;  2:00pm  . NONE TO DATE      Current Outpatient Medications  Medication Sig Dispense Refill  . docusate sodium (COLACE) 100 MG capsule Take 100 mg by mouth 2 (two) times daily as needed for mild constipation.    . pantoprazole (PROTONIX) 40 MG tablet Take 1 tablet (40 mg total) by mouth daily. (Patient taking differently: Take 40 mg by mouth daily before breakfast. ) 30 tablet 3  . Polyethyl Glycol-Propyl Glycol (LUBRICANT EYE DROPS) 0.4-0.3 % SOLN Place 1-2 drops into both eyes 3 (three) times daily as needed (dry eyes.).    Marland Kitchen. ranitidine (ZANTAC) 150 MG tablet Take 150 mg by mouth at bedtime.     No current facility-administered medications for this visit.     Allergies as of 01/10/2018 - Review Complete 11/01/2017  Allergen Reaction Noted  . Nickel Rash 11/08/2013  . Sulfa antibiotics Rash 10/07/2017    No family history on file.  Social History   Socioeconomic History  . Marital status: Married    Spouse name: Not on file  . Number of children: Not on file  . Years of education: Not on file  . Highest education level: Not on file  Occupational History  . Not on file  Social Needs  . Financial resource strain: Not on file  . Food insecurity:  Worry: Not on file    Inability: Not on file  . Transportation needs:    Medical: Not on file    Non-medical: Not on file  Tobacco Use  . Smoking status: Never Smoker  . Smokeless tobacco: Never Used  Substance and Sexual Activity  . Alcohol use: No    Frequency: Never  . Drug use: No  . Sexual activity: Yes    Birth control/protection: None  Lifestyle  . Physical activity:    Days per week: Not on file    Minutes per session: Not on file  . Stress: Not on file   Relationships  . Social connections:    Talks on phone: Not on file    Gets together: Not on file    Attends religious service: Not on file    Active member of club or organization: Not on file    Attends meetings of clubs or organizations: Not on file    Relationship status: Not on file  Other Topics Concern  . Not on file  Social History Narrative  . Not on file    Review of Systems: General: Negative for anorexia, weight loss, fever, chills, fatigue, weakness. Eyes: Negative for vision changes.  ENT: Negative for hoarseness, difficulty swallowing , nasal congestion. CV: Negative for chest pain, angina, palpitations, dyspnea on exertion, peripheral edema.  Respiratory: Negative for dyspnea at rest, dyspnea on exertion, cough, sputum, wheezing.  GI: See history of present illness. GU:  Negative for dysuria, hematuria, urinary incontinence, urinary frequency, nocturnal urination.  MS: Negative for joint pain, low back pain.  Derm: Negative for rash or itching.  Neuro: Negative for weakness, abnormal sensation, seizure, frequent headaches, memory loss, confusion.  Psych: Negative for anxiety, depression, suicidal ideation, hallucinations.  Endo: Negative for unusual weight change.  Heme: Negative for bruising or bleeding. Allergy: Negative for rash or hives.   Physical Exam: There were no vitals taken for this visit. General:   Alert and oriented. Pleasant and cooperative. Well-nourished and well-developed.  Head:  Normocephalic and atraumatic. Eyes:  Without icterus, sclera clear and conjunctiva pink.  Ears:  Normal auditory acuity. Mouth:  No deformity or lesions, oral mucosa pink.  Throat/Neck:  Supple, without mass or thyromegaly. Cardiovascular:  S1, S2 present without murmurs appreciated. Normal pulses noted. Extremities without clubbing or edema. Respiratory:  Clear to auscultation bilaterally. No wheezes, rales, or rhonchi. No distress.  Gastrointestinal:  +BS, soft,  non-tender and non-distended. No HSM noted. No guarding or rebound. No masses appreciated.  Rectal:  Deferred  Musculoskalatal:  Symmetrical without gross deformities. Normal posture. Skin:  Intact without significant lesions or rashes. Neurologic:  Alert and oriented x4;  grossly normal neurologically. Psych:  Alert and cooperative. Normal mood and affect. Heme/Lymph/Immune: No significant cervical adenopathy. No excessive bruising noted.    01/10/2018 8:27 AM   Disclaimer: This note was dictated with voice recognition software. Similar sounding words can inadvertently be transcribed and may not be corrected upon review.

## 2018-01-10 NOTE — Telephone Encounter (Signed)
PATIENT WAS A NO SHOW AND LETTER SENT  °

## 2018-01-11 NOTE — Telephone Encounter (Signed)
Noted  

## 2018-05-16 IMAGING — US US ABDOMEN LIMITED
1 series · 14 of 25 positions shown · non-contrast
Comparison: None.

CLINICAL DATA: Abdominal pain

EXAM:
ULTRASOUND ABDOMEN LIMITED RIGHT UPPER QUADRANT

[Series 1: us abdomen limited · 0.15mm/px · 14 of 65 slices shown]
[im 1/65]
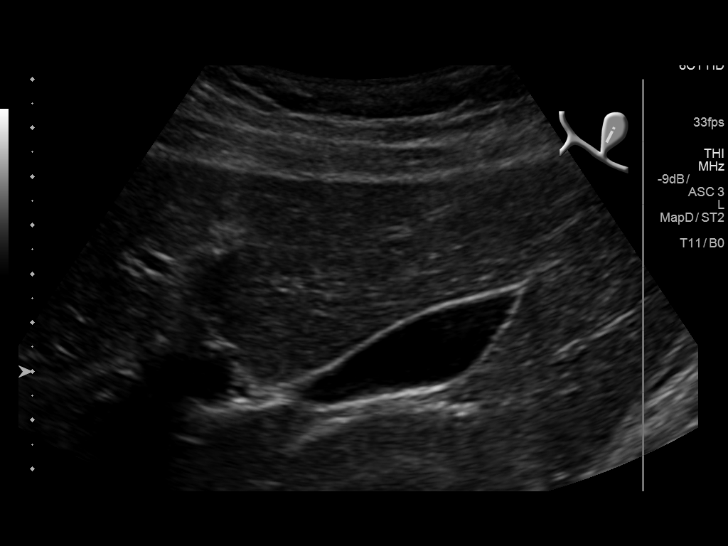
[im 6/65]
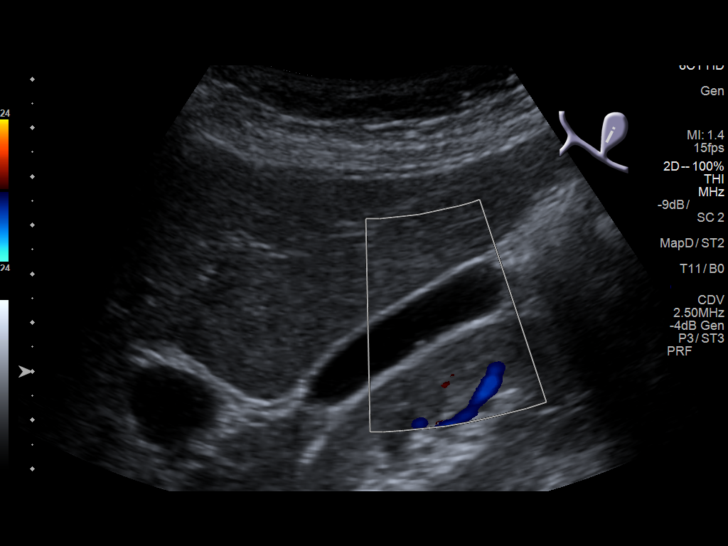
[im 11/65]
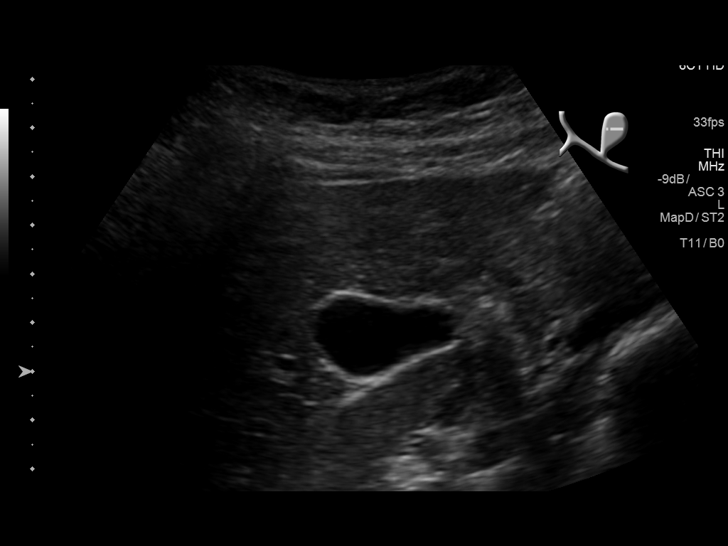
[im 17/65]
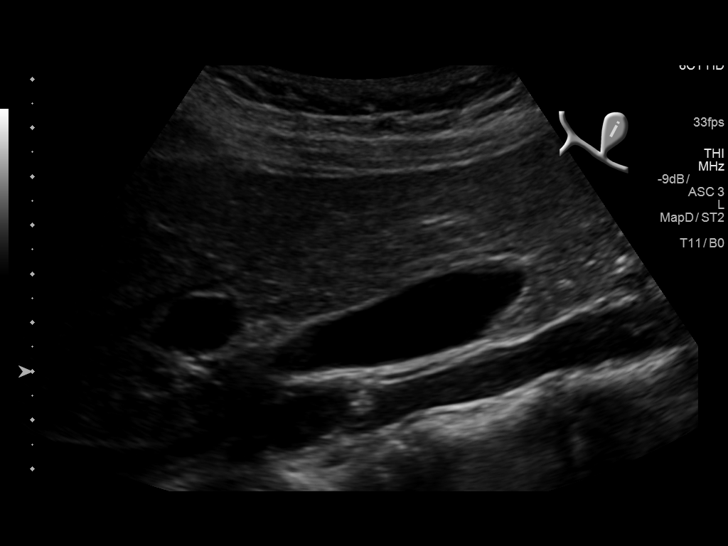
[im 22/65]
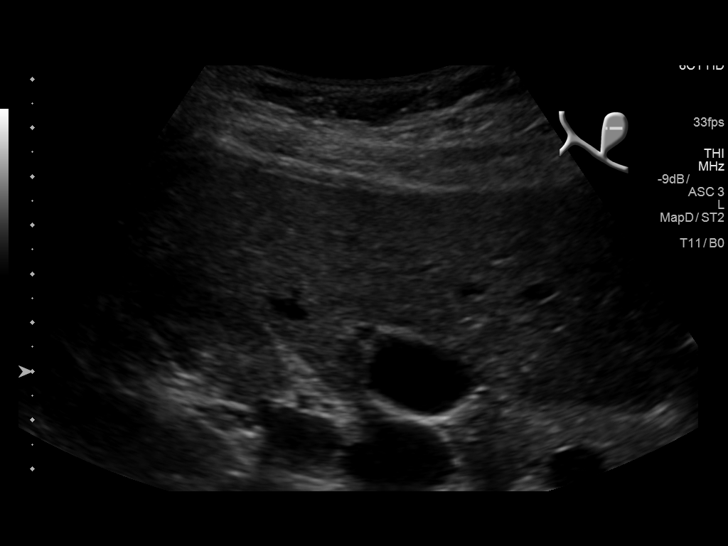
[im 25/65]
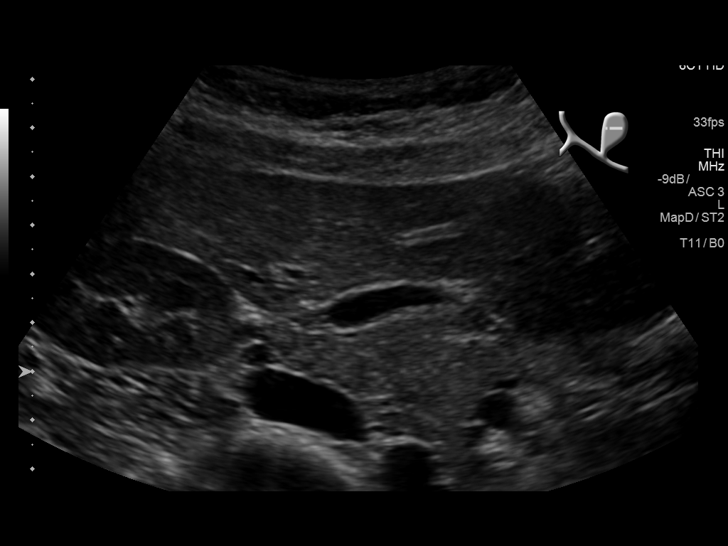
[im 30/65]
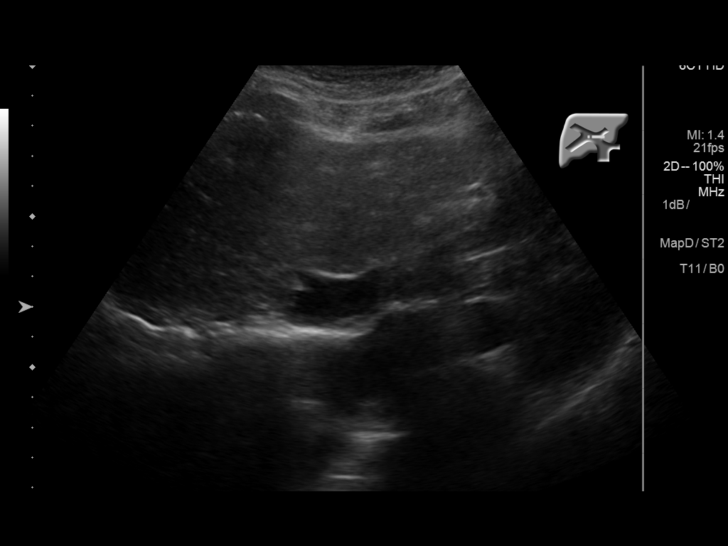
[im 35/65]
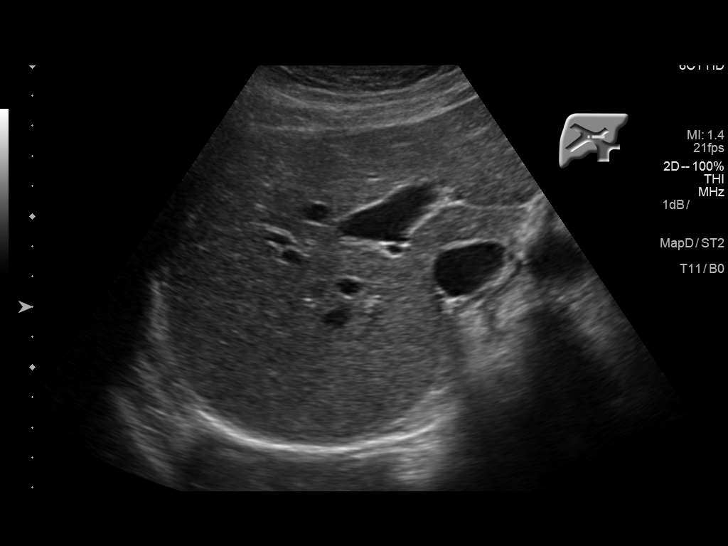
[im 41/65]
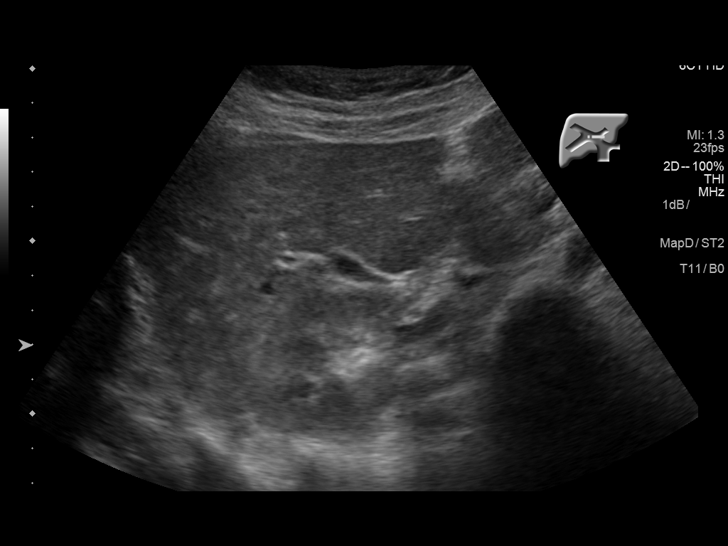
[im 43/65]
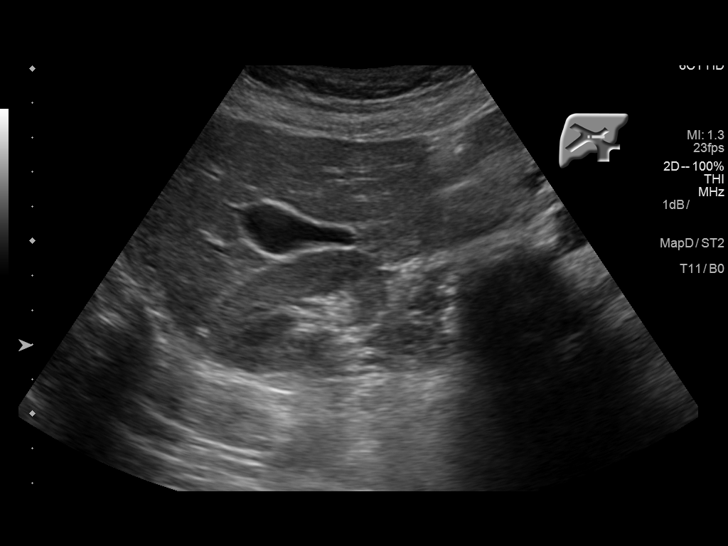
[im 49/65]
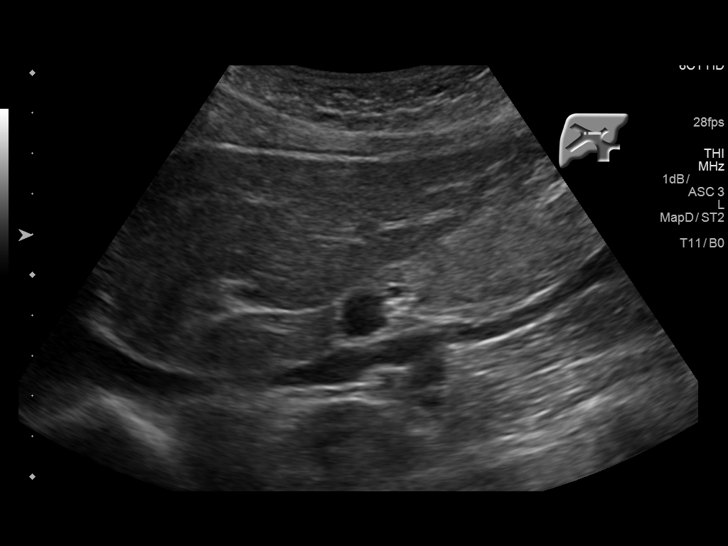
[im 54/65]
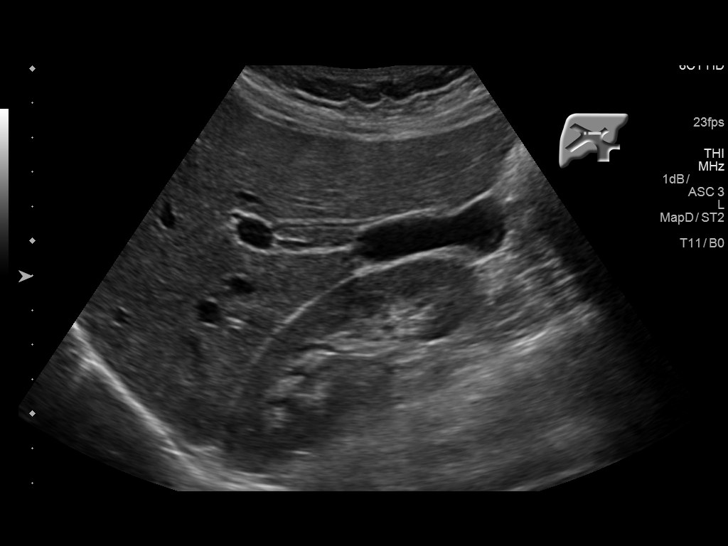
[im 59/65]
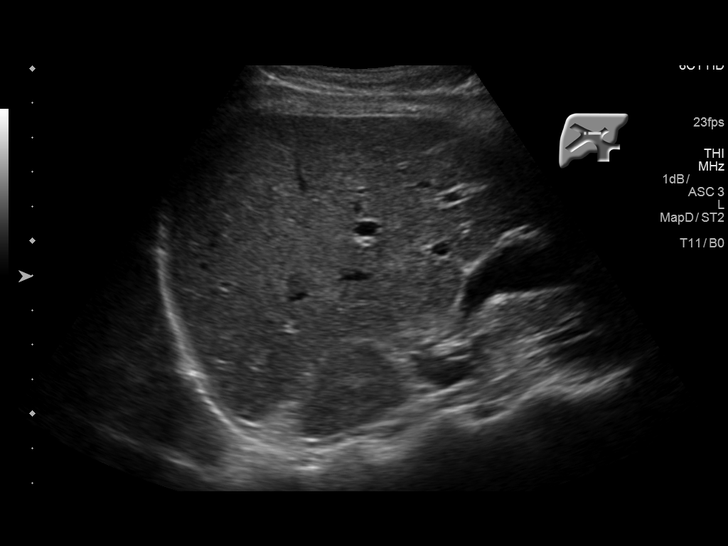
[im 65/65]
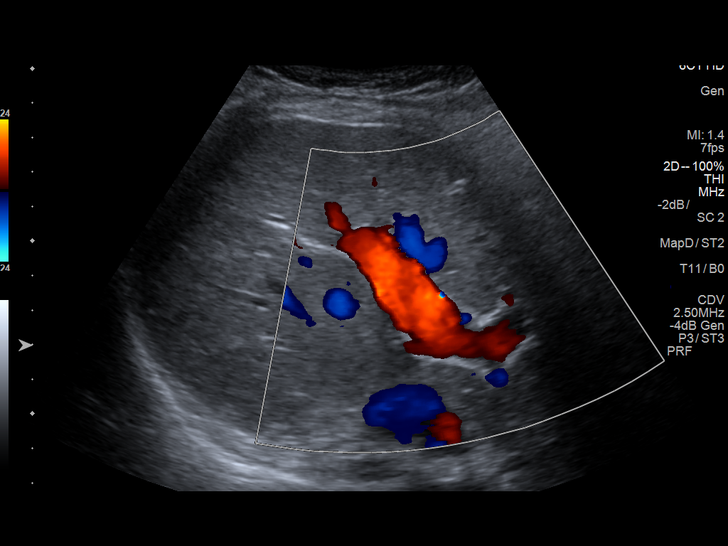

[14 of 25 positions shown; findings below may reference images not displayed]

FINDINGS: Gallbladder:

There is a single gallbladder polyp versus adherent sludge ball that
measures 2 mm. No positive sonographic Murphy sign. No wall
thickening or pericholecystic fluid. No cholelithiasis.

Common bile duct:

Diameter: 1.3 mm

Liver:

No focal lesion identified. Within normal limits in parenchymal
echogenicity. Portal vein is patent on color Doppler imaging with
normal direction of blood flow towards the liver.
IMPRESSION: 1. No evidence of acute cholecystitis.
2. 2 mm gallbladder polyp versus adherent sludge ball. This is an
incidental finding and does not require follow-up.

## 2021-10-10 ENCOUNTER — Encounter: Payer: Self-pay | Admitting: Internal Medicine

## 2021-10-10 ENCOUNTER — Other Ambulatory Visit: Payer: Self-pay

## 2021-10-10 ENCOUNTER — Ambulatory Visit (INDEPENDENT_AMBULATORY_CARE_PROVIDER_SITE_OTHER): Payer: 59 | Admitting: Internal Medicine

## 2021-10-10 VITALS — BP 115/79 | HR 81 | Temp 97.5°F | Ht 61.0 in | Wt 129.6 lb

## 2021-10-10 DIAGNOSIS — K219 Gastro-esophageal reflux disease without esophagitis: Secondary | ICD-10-CM

## 2021-10-10 DIAGNOSIS — R1013 Epigastric pain: Secondary | ICD-10-CM

## 2021-10-10 DIAGNOSIS — R194 Change in bowel habit: Secondary | ICD-10-CM

## 2021-10-10 DIAGNOSIS — R109 Unspecified abdominal pain: Secondary | ICD-10-CM

## 2021-10-10 MED ORDER — PANTOPRAZOLE SODIUM 40 MG PO TBEC: 40.0000 mg | DELAYED_RELEASE_TABLET | Freq: Every day | ORAL | 11 refills | Status: DC

## 2021-10-10 NOTE — Progress Notes (Signed)
Primary Care Physician:  Erasmo Downer, NP Primary Gastroenterologist:  Dr. Jena Gauss  HPI:  Holly Patton is a 36 y.o. female here for further evaluation of recurrent reflux symptoms and epigastric pain.  Stop taking her Protonix previously with insidious recurrence of symptoms.  No dysphagia.  No nausea or vomiting.  Also, complains of suboptimal bowel function -  may have 2-3 bowel movements daily; does not feel that she evacuates adequately.  No melena or rectal bleeding.  Prior EGD demonstrated mild erosive reflux esophagitis.  Prior ultrasound demonstrated possible gallbladder sludge versus tiny polyp.  She is up-to-date on GYN evaluation.  Recently LFTs and CBC completely normal.   Past Medical History:  Diagnosis Date   Anxiety    GERD (gastroesophageal reflux disease)    Heart murmur     Past Surgical History:  Procedure Laterality Date   ESOPHAGOGASTRODUODENOSCOPY (EGD) WITH PROPOFOL N/A 11/01/2017   Procedure: ESOPHAGOGASTRODUODENOSCOPY (EGD) WITH PROPOFOL;  Surgeon: Corbin Ade, MD;  Location: [facility];  Service: Endoscopy;  Laterality: N/A;  2:00pm   NONE TO DATE      Prior to Admission medications   Medication Sig Start Date End Date Taking? Authorizing Provider  lansoprazole (PREVACID) 15 MG capsule Take 15 mg by mouth daily at 12 noon.   Yes [provider]    Allergies as of 10/10/2021 - Review Complete 10/10/2021  Allergen Reaction Noted   Nickel Rash 11/08/2013   Sulfa antibiotics Rash 10/07/2017    No family history on file.  Social History   Socioeconomic History   Marital status: Married    Spouse name: Not on file   Number of children: Not on file   Years of education: Not on file   Highest education level: Not on file  Occupational History   Not on file  Tobacco Use   Smoking status: Never   Smokeless tobacco: Never  Vaping Use   Vaping Use: Never used  Substance and Sexual Activity   Alcohol use: No   Drug use: No    Sexual activity: Yes    Birth control/protection: None  Other Topics Concern   Not on file  Social History Narrative   Not on file   Social Determinants of Health   Financial Resource Strain: Not on file  Food Insecurity: Not on file  Transportation Needs: Not on file  Physical Activity: Not on file  Stress: Not on file  Social Connections: Not on file  Intimate Partner Violence: Not on file    Review of Systems: See HPI, otherwise negative ROS  Physical Exam: BP 115/79    Pulse 81    Temp (!) 97.5 F (36.4 C)    Ht 5\' 1"  (1.549 m)    Wt 129 lb 9.6 oz (58.8 kg)    LMP 09/18/2021    BMI 24.49 kg/m  General:   Alert,   pleasant and cooperative in NAD Neck:  Supple; no masses or thyromegaly. No significant cervical adenopathy. Lungs:  Clear throughout to auscultation.   No wheezes, crackles, or rhonchi. No acute distress. Heart:  Regular rate and rhythm; no murmurs, clicks, rubs,  or gallops. Abdomen: Non-distended, normal bowel sounds.  Soft and nontender without appreciable mass or hepatosplenomegaly.  Pulses:  Normal pulses noted. Extremities:  Without clubbing or edema.  Impression/Plan: 36 year old lady with flare of GERD off acid suppression therapy.  Had a lengthy discussion about the chronic nature of GERD and the need for ongoing treatment including lifestyle  modification for control of symptoms.  There are no alarm feature. Nonspecific bowel symptoms; may do better with a fiber supplement.  Recommendations:  Begin Protonix 40 mg 1 tablet each morning 30 minutes before breakfast (dispense 30 with 11 refills)  Stop lansoprazole  Begin Benefiber 1 tablespoon each morning daily for 3 weeks; then increase to 2 tablespoons daily thereafter With a history of GERD  Office visit in 2 months   Notice: This dictation was prepared with Dragon dictation along with smaller phrase technology. Any transcriptional errors that result from this process are unintentional and may  not be corrected upon review.

## 2021-10-10 NOTE — Patient Instructions (Addendum)
It was good seeing you again today!  It appears you did well with Protonix previously  Begin Protonix 40 mg 1 tablet each morning 30 minutes before breakfast (dispense 30 with 11 refills)  Stop lansoprazole   begin Benefiber 1 tablespoon each morning daily for 3 weeks; then increase to 2 tablespoons daily thereafter With a history of GERD

## 2021-12-11 ENCOUNTER — Encounter: Payer: Self-pay | Admitting: Internal Medicine

## 2021-12-16 ENCOUNTER — Other Ambulatory Visit: Payer: Self-pay

## 2021-12-16 ENCOUNTER — Encounter: Payer: Self-pay | Admitting: Internal Medicine

## 2021-12-16 ENCOUNTER — Ambulatory Visit (INDEPENDENT_AMBULATORY_CARE_PROVIDER_SITE_OTHER): Payer: No Typology Code available for payment source | Admitting: Internal Medicine

## 2021-12-16 VITALS — BP 110/70 | HR 65 | Temp 97.5°F | Ht 61.0 in | Wt 130.6 lb

## 2021-12-16 DIAGNOSIS — R1013 Epigastric pain: Secondary | ICD-10-CM

## 2021-12-16 DIAGNOSIS — K5909 Other constipation: Secondary | ICD-10-CM | POA: Diagnosis not present

## 2021-12-16 DIAGNOSIS — K219 Gastro-esophageal reflux disease without esophagitis: Secondary | ICD-10-CM

## 2021-12-16 DIAGNOSIS — R109 Unspecified abdominal pain: Secondary | ICD-10-CM

## 2021-12-16 NOTE — Progress Notes (Signed)
? ? ?Primary Care Physician:  Renee Rival, NP ?Primary Gastroenterologist:  Dr. Gala Romney ? ?Pre-Procedure History & Physical: ?HPI:  Holly Patton is a 35 y.o. female here for of intermittent epigastric distress GERD symptoms.  Seen back in January of this year.  PPI escalated to Protonix 40 mg daily Benefiber added for constipation and sensation she was not evacuating very well.  No melena or rectal bleeding. ?GERD component markedly improved.  Still has sensation she does not evacuate very well on Benefiber. ?She did not take her Protonix today. ?She notes when she seems to have a adequate bowel movement epigastric pain lessens. ? ?Past Medical History:  ?Diagnosis Date  ? Anxiety   ? GERD (gastroesophageal reflux disease)   ? Heart murmur   ? ? ?Past Surgical History:  ?Procedure Laterality Date  ? ESOPHAGOGASTRODUODENOSCOPY (EGD) WITH PROPOFOL N/A 11/01/2017  ? Procedure: ESOPHAGOGASTRODUODENOSCOPY (EGD) WITH PROPOFOL;  Surgeon: Daneil Dolin, MD;  Location: [facility];  Service: Endoscopy;  Laterality: N/A;  2:00pm  ? NONE TO DATE    ? ? ?Prior to Admission medications   ?Medication Sig Start Date End Date Taking? Authorizing Provider  ?pantoprazole (PROTONIX) 40 MG tablet Take 1 tablet (40 mg total) by mouth daily for 30 doses. 10/10/21 12/16/21 Yes Reilyn Nelson, Cristopher Estimable, MD  ?lansoprazole (PREVACID) 15 MG capsule Take 15 mg by mouth daily at 12 noon. ?Patient not taking: Reported on 12/16/2021    [provider]  ? ? ?Allergies as of 12/16/2021 - Review Complete 12/16/2021  ?Allergen Reaction Noted  ? Nickel Rash 11/08/2013  ? Sulfa antibiotics Rash 10/07/2017  ? ? ?No family history on file. ? ?Social History  ? ?Socioeconomic History  ? Marital status: Married  ?  Spouse name: Not on file  ? Number of children: Not on file  ? Years of education: Not on file  ? Highest education level: Not on file  ?Occupational History  ? Not on file  ?Tobacco Use  ? Smoking status: Never  ? Smokeless tobacco:  Never  ?Vaping Use  ? Vaping Use: Never used  ?Substance and Sexual Activity  ? Alcohol use: No  ? Drug use: No  ? Sexual activity: Yes  ?  Birth control/protection: None  ?Other Topics Concern  ? Not on file  ?Social History Narrative  ? Not on file  ? ?Social Determinants of Health  ? ?Financial Resource Strain: Not on file  ?Food Insecurity: Not on file  ?Transportation Needs: Not on file  ?Physical Activity: Not on file  ?Stress: Not on file  ?Social Connections: Not on file  ?Intimate Partner Violence: Not on file  ? ? ?Review of Systems: ?See HPI, otherwise negative ROS ? ?Physical Exam: ?BP 110/70 (BP Location: Left Arm, Patient Position: Sitting, Cuff Size: Normal)   Pulse 65   Temp (!) 97.5 ?F (36.4 ?C) (Temporal)   Ht 5\' 1"  (1.549 m)   Wt 130 lb 9.6 oz (59.2 kg)   SpO2 99%   BMI 24.68 kg/m?  ?General:   Alert,  Well-developed, well-nourished, pleasant and cooperative in NAD ?Lungs:  Clear throughout to auscultation.   No wheezes, crackles, or rhonchi. No acute distress. ?Heart:  Regular rate and rhythm; no murmurs, clicks, rubs,  or gallops. ?Abdomen: Non-distended, normal bowel sounds.  Soft and nontender without appreciable mass or hepatosplenomegaly.  ?Pulses:  Normal pulses noted. ?Extremities:  Without clubbing or edema. ?Rectal: No perianal or coccygeal abnormality/tenderness.  No mass in the rectal vault.  No impaction.  Scant brown stools Hemoccult negative ? ?Impression/Plan: 36 year old lady with intermittent burning epigastric pain,  bloating sensation / incomplete evacuation.  Feels Benefiber alone inadequate; constipation continues to be an issue..  Typical heartburn symptoms have been pretty well squelch with pantoprazole. ? ?She likely has an element of dyspepsia in the setting of constipation. ? ?Further management of constipation warranted. ? ?Need to screen for H. pylori. ? ?Recommendations: ? ?Get H. pylori stool antigen testing done on April 12 or 13; do not resume Protonix until  after you have submitted the stool sample ? ?I recommend you continue taking Benefiber daily ? ?Add MiraLAX 17 g orally at bedtime as needed for constipation ? ?Call me in about 3 to 4 weeks and let me know how you are doing with the MiraLAX. ? ?Office visit here in 6 weeks. ? ?Notice: This dictation was prepared with Dragon dictation along with smaller phrase technology. Any transcriptional errors that result from this process are unintentional and may not be corrected upon review.  ?

## 2021-12-16 NOTE — Patient Instructions (Signed)
It was good seeing you again today! ? ?Get H. pylori stool antigen testing done on April 12 or 13; do not resume Protonix until after you have submitted the stool sample ? ?I recommend you continue taking Benefiber daily ? ?Add MiraLAX 17 g orally at bedtime as needed for constipation ? ?Call me in about 3 to 4 weeks and let me know how you are doing with the MiraLAX. ? ?Office visit here in 6 weeks. ?

## 2022-01-02 LAB — HELICOBACTER PYLORI  SPECIAL ANTIGEN
MICRO NUMBER:: 13261551
SPECIMEN QUALITY: ADEQUATE

## 2022-01-27 ENCOUNTER — Encounter: Payer: Self-pay | Admitting: Internal Medicine

## 2022-01-27 ENCOUNTER — Ambulatory Visit: Payer: No Typology Code available for payment source | Admitting: Internal Medicine

## 2022-04-15 ENCOUNTER — Other Ambulatory Visit: Payer: Self-pay

## 2022-04-15 DIAGNOSIS — R1013 Epigastric pain: Secondary | ICD-10-CM

## 2022-04-15 DIAGNOSIS — R109 Unspecified abdominal pain: Secondary | ICD-10-CM

## 2022-04-15 MED ORDER — PANTOPRAZOLE SODIUM 40 MG PO TBEC: 40.0000 mg | DELAYED_RELEASE_TABLET | Freq: Every day | ORAL | 3 refills | Status: DC

## 2023-01-13 ENCOUNTER — Encounter: Payer: Self-pay | Admitting: Podiatrist

## 2023-01-13 ENCOUNTER — Ambulatory Visit (INDEPENDENT_AMBULATORY_CARE_PROVIDER_SITE_OTHER): Payer: No Typology Code available for payment source | Admitting: Podiatrist

## 2023-01-13 DIAGNOSIS — L603 Nail dystrophy: Secondary | ICD-10-CM | POA: Diagnosis not present

## 2023-01-13 NOTE — Patient Instructions (Signed)

## 2023-01-13 NOTE — Progress Notes (Signed)
  Chief Complaint  Patient presents with   Nail Problem    2nd toenail left - thick nail x 6 months, noticed in the last few months it has started to pull away from the skin, tender   New Patient (Initial Visit)     HPI: Patient is 37 y.o. female who presents today for a thick second toenail left.  She relates in the last few months the nail has started to pull away from the underlying skin attachment.  She relates some discomfort. No redness, swelling or signs of infection reported.   Patient Active Problem List   Diagnosis Date Noted   Dyspepsia 10/07/2017   Abdominal pain 10/07/2017   Constipation 10/07/2017    Current Outpatient Medications on File Prior to Visit  Medication Sig Dispense Refill   pantoprazole (PROTONIX) 40 MG tablet Take 1 tablet (40 mg total) by mouth daily for 360 doses. 90 tablet 3   No current facility-administered medications on file prior to visit.    Allergies  Allergen Reactions   Nickel Rash   Sulfa Antibiotics Rash    Review of Systems No fevers, chills, nausea, muscle aches, no difficulty breathing, no calf pain, no chest pain or shortness of breath.   Physical Exam  GENERAL APPEARANCE: Alert, conversant. Appropriately groomed. No acute distress.   VASCULAR: Pedal pulses palpable 2/4 DP and PT bilateral.  Capillary refill time is immediate to all digits,  Proximal to distal cooling is warm to warm.  Digital perfusion adequate.   NEUROLOGIC: sensation is intact to 5.07 monofilament at 5/5 sites bilateral.  Light touch is intact bilateral, vibratory sensation intact bilateral  MUSCULOSKELETAL: acceptable muscle strength, tone and stability bilateral.  No gross boney pedal deformities noted.  No pain, crepitus or limitation noted with foot and ankle range of motion bilateral.   DERMATOLOGIC: skin is warm, supple, and dry. Left second toenail is thick and loose from the underlying nail bed.  No redness, no swelling no drainage or sign of  infection noted.      Assessment     ICD-10-CM   1. Nail dystrophy  L60.3 Fungus Culture with Smear       Plan  Treatment options and alternatives discussed.  Recommended a non permanent removal of the left second toenail with culture of the toenail removed to see if any fungal elements are present.  bilateral, left second toe was prepped with alcohol and a 1 to 1 mix of 0.5% marcaine plain and 2% lidocaine plain was administered in a digital block fashion.  A betadine  prep was perform and the digit was exsanguinated.  The offending nail was then carefully removed and sent for fungal culture..  The area was then cleansed well and antibiotic ointment and a dry sterile dressing was applied.  The patient was dispensed instructions for aftercare and will be seen back in 2 weeks for a nail check and to go over results from the culture taken.  If any questions arise prior to this visit she will call.

## 2023-01-28 ENCOUNTER — Ambulatory Visit: Payer: No Typology Code available for payment source | Admitting: Podiatry

## 2023-02-03 ENCOUNTER — Encounter: Payer: Self-pay | Admitting: Podiatry

## 2023-02-05 ENCOUNTER — Telehealth: Payer: Self-pay | Admitting: *Deleted

## 2023-02-05 NOTE — Telephone Encounter (Signed)
Called patient to give results, no answer, could not leave voice message , mailbox full

## 2023-02-09 ENCOUNTER — Telehealth: Payer: Self-pay | Admitting: *Deleted

## 2023-02-09 NOTE — Telephone Encounter (Signed)
-----   Message from Elinor Parkinson, [place] sent at 02/03/2023 12:21 PM EDT ----- NEGATIVE for fungus

## 2023-02-10 NOTE — Telephone Encounter (Signed)
Called patient to give results,no answer, mailbox is full.

## 2023-12-28 ENCOUNTER — Encounter: Payer: Self-pay | Admitting: *Deleted

## 2024-01-05 ENCOUNTER — Encounter: Payer: Self-pay | Admitting: Gastroenterology

## 2024-01-05 ENCOUNTER — Ambulatory Visit (INDEPENDENT_AMBULATORY_CARE_PROVIDER_SITE_OTHER): Admitting: Gastroenterology

## 2024-01-05 VITALS — BP 134/83 | HR 76 | Temp 98.1°F | Ht 61.0 in | Wt 145.0 lb

## 2024-01-05 DIAGNOSIS — R1010 Upper abdominal pain, unspecified: Secondary | ICD-10-CM | POA: Diagnosis not present

## 2024-01-05 MED ORDER — PANTOPRAZOLE SODIUM 40 MG PO TBEC: 40.0000 mg | DELAYED_RELEASE_TABLET | Freq: Every day | ORAL | 3 refills | Status: AC

## 2024-01-05 MED ORDER — SUCRALFATE 1 G PO TABS: ORAL_TABLET | ORAL | 0 refills | Status: AC

## 2024-01-05 NOTE — Patient Instructions (Signed)
 Continue pantoprazole 40mg  daily, 30-60 minutes before first meal of the day. Add sucralfate 1 grams up to three times daily for stomach burning. Use only as needed. Abdominal ultrasound to be scheduled. Complete labs at your convenience, you can have done day of ultrasound if you prefer. Limit foods and activities as outlined in the handouts provided today.

## 2024-01-05 NOTE — Progress Notes (Unsigned)
 GI Office Note    Referring Provider: Erasmo Downer, NP Primary Care Physician:  Erasmo Downer, NP  Primary Gastroenterologist: Roetta Sessions, MD   Chief Complaint   Chief Complaint  Patient presents with   Gastroesophageal Reflux    Pt is having abdominal burning and states that sometimes she will feel a knot in her throat    History of Present Illness   Holly Patton is a 38 y.o. female presenting today for follow up GERD. Last seen 11/2021. H.pylori stool antigen negative.  Labs 10/2023: A1c 6.2 Labs 10/2023: White blood cell count 8200, hemoglobin 12.8, platelets 265,000, creatinine 0.69, albumin 4.3, alk phos 86, AST 14, ALT 12, total bilirubin 0.4.  Today: Having burning in the upper stomach but does not feel like heartburn. Sometimes more intense and then tapers off. At times feels pain just under right rib cage, dime size knot comes up but then goes away. Sometimes with burning up in the throat, feels like knot on outside of neck. Felt it once but it resolved. Rare solid food dysphagia, happened 2-3 times. Denies NSAIDs. Sometimes abdominal burning better after eating but returns one hour later. Sometimes drinking water makes her burn. Causes her to feel irritable. BM used to be regular with coffee but had to stop due to pregnancy and it also made her anxiety worse. Some urgency, but still has solid stool. Feels incomplete. Unusual to have diarrhea but did the other day.   States her husband feels she is a hypochondriac.   Seen by her PCP at [facility]. Started back on pantoprazole at end of February but advised to take only two weeks but could continue for 30 days if needed. States her symptoms really have not improved. She has been taking pantoprazole at 5am, eats at 9am.  She is 3 months postpartum. She is breastfeeding. She is about 20 pounds over her pre-pregnancy weight with her last baby.       EGD 12/2017: -mild erosive reflux  esophagitis   Medications   Current Outpatient Medications  Medication Sig Dispense Refill   pantoprazole (PROTONIX) 40 MG tablet Take 1 tablet (40 mg total) by mouth daily for 360 doses. 90 tablet 3   No current facility-administered medications for this visit.    Allergies   Allergies as of 01/05/2024 - Review Complete 01/05/2024  Allergen Reaction Noted   Nickel Rash 11/08/2013   Sulfa antibiotics Rash 10/07/2017      Review of Systems   General: Negative for anorexia, weight loss, fever, chills, fatigue, weakness. ENT: Negative for hoarseness, difficulty swallowing , nasal congestion. CV: Negative for chest pain, angina, palpitations, dyspnea on exertion, peripheral edema.  Respiratory: Negative for dyspnea at rest, dyspnea on exertion, cough, sputum, wheezing.  GI: See history of present illness. GU:  Negative for dysuria, hematuria, urinary incontinence, urinary frequency, nocturnal urination.  Endo: Negative for unusual weight change.     Physical Exam   BP 134/83 (BP Location: Right Arm, Patient Position: Sitting, Cuff Size: Normal)   Pulse 76   Temp 98.1 F (36.7 C) (Oral)   Ht 5\' 1"  (1.549 m)   Wt 145 lb (65.8 kg)   LMP  (LMP Unknown)   SpO2 97%   Breastfeeding Yes   BMI 27.40 kg/m    General: Well-nourished, well-developed in no acute distress.  Eyes: No icterus. Mouth: Oropharyngeal mucosa moist and pink  Lungs: Clear to auscultation bilaterally.  Heart: Regular rate and rhythm, no  murmurs rubs or gallops.  Abdomen: Bowel sounds are normal,   nondistended, no hepatosplenomegaly or masses,  no abdominal bruits or hernia , no rebound or guarding. Mild epigastric tenderness Rectal: not performed  Extremities: No lower extremity edema. No clubbing or deformities. Neuro: Alert and oriented x 4   Skin: Warm and dry, no jaundice.   Psych: Alert and cooperative, normal mood and affect.  Labs  See hpi Imaging Studies   No results  found.  Assessment/Plan:   Upper abdominal pain: -likely dyspepsia with GERD as well -alter the way she is taking PPI, instructed her to take pantoprazole 40mg  each morning about 30 minutes before first meal of day -add short course of carafate 1g tid prn abdominal pain -abdominal u/s to rule out biliary etiology -CBC, CMET, lipase -reinforced antireflux/dyspepsia measures.      Trudie Fuse. Harles Lied, MHS, PA-C [facility]

## 2024-01-11 ENCOUNTER — Ambulatory Visit (HOSPITAL_COMMUNITY)
Admission: RE | Admit: 2024-01-11 | Discharge: 2024-01-11 | Disposition: A | Discharge status: Home / Self Care | Source: Ambulatory Visit | Attending: Gastroenterology | Admitting: Gastroenterology

## 2024-01-11 DIAGNOSIS — R1010 Upper abdominal pain, unspecified: Secondary | ICD-10-CM | POA: Insufficient documentation

## 2024-01-18 ENCOUNTER — Ambulatory Visit: Admitting: Internal Medicine

## 2024-02-07 ENCOUNTER — Encounter: Payer: Self-pay | Admitting: Internal Medicine

## 2024-02-07 ENCOUNTER — Ambulatory Visit: Admitting: Internal Medicine

## 2024-02-17 NOTE — Telephone Encounter (Signed)
 Pt was made aware and verbalized understanding.

## 2024-03-24 LAB — CBC WITH DIFFERENTIAL/PLATELET
Basophils Absolute: 0 x10E3/uL (ref 0.0–0.2)
Basos: 0 %
EOS (ABSOLUTE): 0.2 x10E3/uL (ref 0.0–0.4)
Eos: 2 %
Hematocrit: 43.2 % (ref 34.0–46.6)
Hemoglobin: 14.8 g/dL (ref 11.1–15.9)
Immature Grans (Abs): 0 x10E3/uL (ref 0.0–0.1)
Immature Granulocytes: 0 %
Lymphocytes Absolute: 2.9 x10E3/uL (ref 0.7–3.1)
Lymphs: 34 %
MCH: 30.8 pg (ref 26.6–33.0)
MCHC: 34.3 g/dL (ref 31.5–35.7)
MCV: 90 fL (ref 79–97)
Monocytes Absolute: 0.6 x10E3/uL (ref 0.1–0.9)
Monocytes: 8 %
Neutrophils Absolute: 4.8 x10E3/uL (ref 1.4–7.0)
Neutrophils: 56 %
Platelets: 265 x10E3/uL (ref 150–450)
RBC: 4.8 x10E6/uL (ref 3.77–5.28)
RDW: 12.3 % (ref 11.7–15.4)
WBC: 8.6 x10E3/uL (ref 3.4–10.8)

## 2024-03-24 LAB — COMPREHENSIVE METABOLIC PANEL WITH GFR
ALT: 14 IU/L (ref 0–32)
AST: 12 IU/L (ref 0–40)
Albumin: 4.4 g/dL (ref 3.9–4.9)
Alkaline Phosphatase: 111 IU/L (ref 44–121)
BUN/Creatinine Ratio: 18 (ref 9–23)
BUN: 16 mg/dL (ref 6–20)
Bilirubin Total: <0.2 mg/dL (ref 0.0–1.2)
CO2: 20 mmol/L (ref 20–29)
Calcium: 9.6 mg/dL (ref 8.7–10.2)
Chloride: 105 mmol/L (ref 96–106)
Creatinine, Ser: 0.88 mg/dL (ref 0.57–1.00)
Globulin, Total: 2.6 g/dL (ref 1.5–4.5)
Glucose: 111 mg/dL — ABNORMAL HIGH (ref 70–99)
Potassium: 4.3 mmol/L (ref 3.5–5.2)
Sodium: 141 mmol/L (ref 134–144)
Total Protein: 7 g/dL (ref 6.0–8.5)
eGFR: 86 mL/min/1.73 (ref >59)

## 2024-03-24 LAB — LIPASE: Lipase: 36 U/L (ref 14–72)

## 2024-04-08 ENCOUNTER — Ambulatory Visit: Payer: Self-pay | Admitting: Gastroenterology
